# Patient Record
Sex: Male | Born: 1990 | Race: Black or African American | Hispanic: No | Marital: Single | Smoking: Current every day smoker
Health system: Southern US, Community
[De-identification: ages and names within clinical notes are randomized; demographics above are authoritative.]

## PROBLEM LIST (undated history)

## (undated) DIAGNOSIS — J45909 Unspecified asthma, uncomplicated: Secondary | ICD-10-CM

---

## 2015-05-11 ENCOUNTER — Emergency Department (HOSPITAL_BASED_OUTPATIENT_CLINIC_OR_DEPARTMENT_OTHER): Payer: No Typology Code available for payment source

## 2015-05-11 ENCOUNTER — Encounter (HOSPITAL_BASED_OUTPATIENT_CLINIC_OR_DEPARTMENT_OTHER): Payer: Self-pay | Admitting: *Deleted

## 2015-05-11 ENCOUNTER — Emergency Department (HOSPITAL_BASED_OUTPATIENT_CLINIC_OR_DEPARTMENT_OTHER)
Admission: EM | Admit: 2015-05-11 | Discharge: 2015-05-11 | Disposition: A | Discharge status: Home / Self Care | Payer: No Typology Code available for payment source | Attending: Emergency Medicine | Admitting: Emergency Medicine

## 2015-05-11 DIAGNOSIS — Y92410 Unspecified street and highway as the place of occurrence of the external cause: Secondary | ICD-10-CM | POA: Insufficient documentation

## 2015-05-11 DIAGNOSIS — Z880 Allergy status to penicillin: Secondary | ICD-10-CM | POA: Diagnosis not present

## 2015-05-11 DIAGNOSIS — Z720 Tobacco use: Secondary | ICD-10-CM | POA: Diagnosis not present

## 2015-05-11 DIAGNOSIS — S199XXA Unspecified injury of neck, initial encounter: Secondary | ICD-10-CM | POA: Diagnosis not present

## 2015-05-11 DIAGNOSIS — S0990XA Unspecified injury of head, initial encounter: Secondary | ICD-10-CM | POA: Diagnosis not present

## 2015-05-11 DIAGNOSIS — S22060A Wedge compression fracture of T7-T8 vertebra, initial encounter for closed fracture: Secondary | ICD-10-CM | POA: Insufficient documentation

## 2015-05-11 DIAGNOSIS — Y998 Other external cause status: Secondary | ICD-10-CM | POA: Insufficient documentation

## 2015-05-11 DIAGNOSIS — Y9389 Activity, other specified: Secondary | ICD-10-CM | POA: Diagnosis not present

## 2015-05-11 DIAGNOSIS — IMO0002 Reserved for concepts with insufficient information to code with codable children: Secondary | ICD-10-CM

## 2015-05-11 DIAGNOSIS — T1490XA Injury, unspecified, initial encounter: Secondary | ICD-10-CM

## 2015-05-11 DIAGNOSIS — S299XXA Unspecified injury of thorax, initial encounter: Secondary | ICD-10-CM | POA: Diagnosis present

## 2015-05-11 MED ORDER — IBUPROFEN 800 MG PO TABS
800.0000 mg | ORAL_TABLET | Freq: Once | ORAL | Status: AC
  Administered 2015-05-11: 800 mg via ORAL
  Filled 2015-05-11: qty 1

## 2015-05-11 MED ORDER — DIAZEPAM 5 MG PO TABS: 5.0000 mg | ORAL_TABLET | Freq: Four times a day (QID) | ORAL | Status: AC | PRN

## 2015-05-11 MED ORDER — HYDROCODONE-ACETAMINOPHEN 5-325 MG PO TABS: 1.0000 | ORAL_TABLET | Freq: Four times a day (QID) | ORAL | Status: DC | PRN
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
## 2015-05-25 ENCOUNTER — Emergency Department (HOSPITAL_BASED_OUTPATIENT_CLINIC_OR_DEPARTMENT_OTHER)
Admission: EM | Admit: 2015-05-25 | Discharge: 2015-05-25 | Discharge status: Against Medical Advice | Payer: No Typology Code available for payment source | Attending: Emergency Medicine | Admitting: Emergency Medicine

## 2015-05-25 ENCOUNTER — Encounter (HOSPITAL_BASED_OUTPATIENT_CLINIC_OR_DEPARTMENT_OTHER): Payer: Self-pay | Admitting: *Deleted

## 2015-05-25 DIAGNOSIS — Z720 Tobacco use: Secondary | ICD-10-CM | POA: Insufficient documentation

## 2015-05-25 DIAGNOSIS — M549 Dorsalgia, unspecified: Secondary | ICD-10-CM | POA: Diagnosis present
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
## 2015-05-26 ENCOUNTER — Emergency Department (HOSPITAL_BASED_OUTPATIENT_CLINIC_OR_DEPARTMENT_OTHER)
Admission: EM | Admit: 2015-05-26 | Discharge: 2015-05-26 | Disposition: A | Discharge status: Home / Self Care | Payer: No Typology Code available for payment source | Attending: Emergency Medicine | Admitting: Emergency Medicine

## 2015-05-26 ENCOUNTER — Encounter (HOSPITAL_BASED_OUTPATIENT_CLINIC_OR_DEPARTMENT_OTHER): Payer: Self-pay | Admitting: Emergency Medicine

## 2015-05-26 ENCOUNTER — Emergency Department (HOSPITAL_BASED_OUTPATIENT_CLINIC_OR_DEPARTMENT_OTHER): Payer: No Typology Code available for payment source

## 2015-05-26 DIAGNOSIS — Z87828 Personal history of other (healed) physical injury and trauma: Secondary | ICD-10-CM | POA: Insufficient documentation

## 2015-05-26 DIAGNOSIS — Z720 Tobacco use: Secondary | ICD-10-CM | POA: Insufficient documentation

## 2015-05-26 DIAGNOSIS — M545 Low back pain: Secondary | ICD-10-CM | POA: Diagnosis present

## 2015-05-26 DIAGNOSIS — I880 Nonspecific mesenteric lymphadenitis: Secondary | ICD-10-CM | POA: Diagnosis not present

## 2015-05-26 DIAGNOSIS — R1084 Generalized abdominal pain: Secondary | ICD-10-CM | POA: Insufficient documentation

## 2015-05-26 DIAGNOSIS — M542 Cervicalgia: Secondary | ICD-10-CM | POA: Insufficient documentation

## 2015-05-26 DIAGNOSIS — Z880 Allergy status to penicillin: Secondary | ICD-10-CM | POA: Diagnosis not present

## 2015-05-26 DIAGNOSIS — R1110 Vomiting, unspecified: Secondary | ICD-10-CM | POA: Diagnosis not present

## 2015-05-26 DIAGNOSIS — G8911 Acute pain due to trauma: Secondary | ICD-10-CM | POA: Insufficient documentation

## 2015-05-26 DIAGNOSIS — M546 Pain in thoracic spine: Secondary | ICD-10-CM | POA: Diagnosis not present

## 2015-05-26 LAB — BASIC METABOLIC PANEL
Anion gap: 6 (ref 5–15)
BUN: 8 mg/dL (ref 6–20)
CHLORIDE: 105 mmol/L (ref 101–111)
CO2: 31 mmol/L (ref 22–32)
Calcium: 8.5 mg/dL — ABNORMAL LOW (ref 8.9–10.3)
Creatinine, Ser: 1.07 mg/dL (ref 0.61–1.24)
GFR calc Af Amer: >60 mL/min (ref >60)
GFR calc non Af Amer: >60 mL/min (ref >60)
GLUCOSE: 106 mg/dL — AB (ref 65–99)
POTASSIUM: 4.2 mmol/L (ref 3.5–5.1)
Sodium: 142 mmol/L (ref 135–145)

## 2015-05-26 MED ORDER — HYDROCODONE-ACETAMINOPHEN 5-325 MG PO TABS: 1.0000 | ORAL_TABLET | Freq: Four times a day (QID) | ORAL | Status: DC | PRN

## 2015-05-26 MED ORDER — IBUPROFEN 800 MG PO TABS: 800.0000 mg | ORAL_TABLET | Freq: Three times a day (TID) | ORAL | Status: DC

## 2015-05-26 MED ORDER — CYCLOBENZAPRINE HCL 10 MG PO TABS
10.0000 mg | ORAL_TABLET | Freq: Once | ORAL | Status: AC
  Administered 2015-05-26: 10 mg via ORAL
  Filled 2015-05-26: qty 1

## 2015-05-26 MED ORDER — ONDANSETRON HCL 4 MG/2ML IJ SOLN
4.0000 mg | Freq: Once | INTRAMUSCULAR | Status: AC
  Administered 2015-05-26: 4 mg via INTRAVENOUS
  Filled 2015-05-26: qty 2

## 2015-05-26 MED ORDER — IBUPROFEN 800 MG PO TABS
800.0000 mg | ORAL_TABLET | Freq: Once | ORAL | Status: AC
  Administered 2015-05-26: 800 mg via ORAL
  Filled 2015-05-26: qty 1

## 2015-05-26 MED ORDER — HYDROCODONE-ACETAMINOPHEN 5-325 MG PO TABS
2.0000 | ORAL_TABLET | Freq: Once | ORAL | Status: AC
  Administered 2015-05-26: 2 via ORAL
  Filled 2015-05-26: qty 2

## 2015-05-26 MED ORDER — IOHEXOL 300 MG/ML  SOLN
100.0000 mL | Freq: Once | INTRAMUSCULAR | Status: AC | PRN
  Administered 2015-05-26: 100 mL via INTRAVENOUS

## 2015-05-26 MED ORDER — SODIUM CHLORIDE 0.9 % IV BOLUS (SEPSIS)
1000.0000 mL | Freq: Once | INTRAVENOUS | Status: AC
  Administered 2015-05-26: 1000 mL via INTRAVENOUS

## 2015-05-26 NOTE — ED Notes (Signed)
Pt states that he was unable to afford his vicodin or valium prescribed at previous visit
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
## 2015-05-26 NOTE — Discharge Instructions (Signed)
Lymphadenopathy Lymphadenopathy refers to swollen or enlarged lymph glands, also called lymph nodes. Lymph glands are part of your body's defense (immune) system, which protects the body from infections, germs, and diseases. Lymph glands are found in many locations in your body, including the neck, underarm, and groin.  Many things can cause lymph glands to become enlarged. When your immune system responds to germs, such as viruses or bacteria, infection-fighting cells and fluid build up. This causes the glands to grow in size. Usually, this is not something to worry about. The swelling and any soreness often go away without treatment. However, swollen lymph glands can also be caused by a number of diseases. Your health care provider may do various tests to help determine the cause. If the cause of your swollen lymph glands cannot be found, it is important to monitor your condition to make sure the swelling goes away. HOME CARE INSTRUCTIONS Watch your condition for any changes. The following actions may help to lessen any discomfort you are feeling:  Get plenty of rest.  Take medicines only as directed by your health care provider. Your health care provider may recommend over-the-counter medicines for pain.  Apply moist heat compresses to the site of swollen lymph nodes as directed by your health care provider. This can help reduce any pain.  Check your lymph nodes daily for any changes.  Keep all follow-up visits as directed by your health care provider. This is important. SEEK MEDICAL CARE IF:  Your lymph nodes are still swollen after 2 weeks.  Your swelling increases or spreads to other areas.  Your lymph nodes are hard, seem fixed to the skin, or are growing rapidly.  Your skin over the lymph nodes is red and inflamed.  You have a fever.  You have chills.  You have fatigue.  You develop a sore throat.  You have abdominal pain.  You have weight loss.  You have night  sweats. SEEK IMMEDIATE MEDICAL CARE IF:  You notice fluid leaking from the area of the enlarged lymph node.  You have severe pain in any area of your body.  You have chest pain.  You have shortness of breath.   This information is not intended to replace advice given to you by your health care provider. Make sure you discuss any questions you have with your health care provider.   Document Released: 04/26/2008 Document Revised: 08/08/2014 Document Reviewed: 02/20/2014 Elsevier Interactive Patient Education 2016 [employer].   Emergency Department Resource Guide 1) Find a Doctor and Pay Out of Pocket Although you won't have to find out who is covered by your insurance plan, it is a good idea to ask around and get recommendations. You will then need to call the office and see if the doctor you have chosen will accept you as a new patient and what types of options they offer for patients who are self-pay. Some doctors offer discounts or will set up payment plans for their patients who do not have insurance, but you will need to ask so you aren't surprised when you get to your appointment.  2) Contact Your Local Health Department Not all health departments have doctors that can see patients for sick visits, but many do, so it is worth a call to see if yours does. If you don't know where your local health department is, you can check in your phone book. The CDC also has a tool to help you locate your state's health department, and many state websites  also have listings of all of their local health departments.  3) Find a Walk-in Clinic If your illness is not likely to be very severe or complicated, you may want to try a walk in clinic. These are popping up all over the country in pharmacies, drugstores, and shopping centers. They're usually staffed by nurse practitioners or physician assistants that have been trained to treat common illnesses and complaints. They're usually fairly quick and  inexpensive. However, if you have serious medical issues or chronic medical problems, these are probably not your best option.  No Primary Care Doctor: - Call Health Connect at  [phone] - they can help you locate a primary care doctor that  accepts your insurance, provides certain services, etc. - Physician Referral Service- [phone]  Chronic Pain Problems: Organization         Address  Phone   Notes  [facility]  [phone] Patients need to be referred by their primary care doctor.   Medication Assistance: Organization         Address  Phone   Notes  [facility] [address] [place], [place] [address] [phone] --Must be a resident of [facility] [place] -- Must have NO insurance coverage whatsoever (no Medicaid/ Medicare, etc.) -- The pt. MUST have a primary care doctor that directs their care regularly and follows them in the community   [facility]  [phone]   [employer]  [phone]    Agencies that provide inexpensive medical care: Organization         Address  Phone   Notes  [facility]  [phone]   [facility]    [phone]   [facility] [address] [place], [place] [address] [phone]   [facility] [address], [place] [phone]   [facility]    [phone]   [facility]    [phone]   [facility]  [address], [place] Phone:  [phone], Fax:  [phone] Hours of Operation:  9 am - 6 pm, M-F.  Also accepts Medicaid/Medicare and self-pay.  [facility]  [address], [place] Phone: [phone], Fax: [phone]. Hours of Operation:  8:30 am - 5:30 pm, M-F.  Also accepts Medicaid and self-pay.  [facility] [place] [address], [place] Phone: [phone](336) [phone]   [facility] [address], [place], [place] [phone], Ext. [phone] Mondays & Thursdays: 7-9 AM.  First 15 patients are seen on a first come, first serve basis.    Medicaid-accepting [facility] Providers:  Organization         Address  Phone   Notes  [facility] [address], [place] [phone] Also accepts self-pay patients.  [facility] [address], [place]  [phone]) [phone]   [facility] [address], [place] [phone]   [facility] [address] [place] [address], [place] [phone]   Renaye RakersVeita Bland [address], [place]   [phone] Only accepts [place] patients after they have their name applied to their card.   Self-Pay (no insurance) in [facility]:  [employer]         Address  Phone  Notes  °Sickle Cell Patients, [facility] [address], [place] [phone]   °[facility] [address], [place] ([phone]) [phone]   °[facility] ° [address], [place] [phone]) [phone]   °[facility]/Dr. Osei-Bonsu ° [address] [place] [address], [place] or [address], [place] [phone] Phone number for both [place] and [place] locations is the same.  °[facility] [address], [place] [phone]   °[facility] [address] [place] [address], [place] or [address] ([phone]) [phone] °(336) [phone]   °[facility] [address], [place] ([phone]) [phone], phone; [phone], fax Sees patients 1st and 3rd Saturday of every month.  Must not qualify for public or private insurance (i.e. Medicaid, Medicare, [place] Health Choice, Veterans' Benefits) • Household income should be no more than 200% of the poverty level •The clinic cannot treat you if you are pregnant or  think you are pregnant • Sexually transmitted diseases are not treated at the clinic.  ° ° °Dental Care: °Organization         Address  Phone  Notes  °[facility] [address], [place] [phone]) [phone] Accepts children up to age 21 who are enrolled in Medicaid or [place] Health Choice; pregnant women with a Medicaid card; and children who have applied for Medicaid or [place] Health Choice, but were declined, whose parents can pay a reduced fee at time of service.  °[facility] [place]  [address], [place] [phone]) [phone] Accepts children up to age 21 who are enrolled in Medicaid or [place] Health Choice; pregnant women with a Medicaid card; and children who have applied for Medicaid or [place] Health Choice, but were declined, whose parents can pay a reduced fee at time of service.  °[facility] ° [address], [place] [phone]) [phone] Patients are seen by appointment only. Walk-ins are not accepted. [facility] will see patients 18 years of age and older. °Monday - Tuesday (8am-5pm) °Most Wednesdays (8:30-5pm) °$30 per visit, cash only  °[facility] ° [address], [place] [phone]) [phone] Patients are seen by appointment only. Walk-ins are not accepted. [facility] will see patients 18 years of age and older. °One Wednesday Evening (Monthly: Volunteer Based).  $30 per visit, cash only  °[facility]  [phone] for adults; Children under age 4, call Graduate Pediatric Dentistry at [phone]. Children aged 4-14, please call [phone] to request a pediatric application. ° Dental services are provided in all areas of dental care including fillings, crowns and bridges, complete and partial dentures, implants, gum treatment, root canals, and extractions. Preventive care is also provided. Treatment is provided to both adults  and children. °Patients are selected via a lottery and there is often a waiting list. °  °[facility] [address], °[place] ° (336) [phone] [email or web address] °  °[facility] [address], [place], [place] [phone], Ext. [phone] Second and Fourth Thursday of each month, opens at 6:30 AM; Clinic ends at 9 AM.  Patients are seen on a first-come first-served basis, and a limited number are seen during each clinic.  ° °[facility] ° [address], [place], [place] [phone]) [phone]     Eligibility Requirements °You must have lived in [place], [place], or [place] counties for at least the last three months. °  You cannot be eligible for state or federal sponsored healthcare insurance, including Veterans Administration, Medicaid, or Medicare. °  You generally cannot be eligible for healthcare insurance through your employer.  °  How to apply: °Eligibility screenings are held every Tuesday and Wednesday afternoon from 1:00 pm until 4:00 pm. You do not need an appointment for the interview!  °[facility] [address], [place], [place] [phone]   °[facility]  336-342-8273   °[facility]  336-703-3100   °[facility]  336-570-6415   ° °Behavioral Health Resources in the Community: °Intensive Outpatient Programs °Organization         Address  Phone  Notes  °[facility] [address], [place], [place] [phone]   °[facility] [address], [place], [place] 336-832-9800   °[facility] [address], [place], [place] ° 336-882-2125   °[facility] [address],  °[place], [place] 1-800-853-5163 or 336-641-4981   °Substance Abuse Resources °Organization         Address  Phone  Notes  °[facility]  336-882-2125   °[facility]  336-784-9470   °[facility]  336-285-9073     °[facility]  336-845-3988   °Residential & Outpatient Substance Abuse Program  1-800-659-3381   °Psychological Services °Organization         Address  Phone  Notes  °[facility]  336- 832-9600   °[facility]  336- 378-7881   °[facility] [address], [place] 1-800-853-5163 or 336-641-4981   ° °Mobile Crisis Teams °Organization         Address  Phone  Notes  °[facility], Mobile Crisis Care Unit  1-877-626-1772   °Assertive °[facility] ° [address] [place], [place] 336-834-9664   °Sharon DeEsch [address] °[place] 336-554-5454   ° °Self-Help/Support Groups °Organization         Address  Phone             Notes  °[facility] [place] - variety of support groups  336- 373-1402 Call for more information  °Narcotics Anonymous (NA), [facility] [address], °[place]  2 meetings at this location  ° °Residential Treatment Programs °Organization         Address  Phone  Notes  °[facility] [address],    °[place]  1-866-801-8205   °[facility] ° [address], [place], [place] 704-293-8524   °[facility] [address], [place] 336-845-3988 Admissions: 8am-3pm M-F  °[facility] [address]    °[place], [place] 336-841-1104   °[facility] [address], [place], [place] 336-379-7146   °[facility] [address]  °[place], [place] 336-285-9073   °[facility] - Intensive Outpatient [address], [place], [place] 336-852-3033   °[facility] [address]  °[place], [place] 1-877-615-2722 or 336-784-9470   °[facility] ([facility]) [address] [place], [place] 336-227-7417 Accepts Medicaid  °[facility] [address]  ° [place] 1-800-659-3381 Substance Abuse/Addiction Treatment  ° °[place]  Behavioral Health Resources °Organization           Address  Phone  Notes  °[facility]  (888) 581-9988   °Julie Brannon, PhD [address] [place], [place]   (336) 349-5553 or (336) 951-0000   °[facility]   [address] °[place], [place] (336) 349-4454   °[facility] [address], [place], [place] (336) 342-8316 Insurance/Medicaid/sponsorship through [facility]  °[facility] [address]                                    [place], [place] (336) 342-8316 Therapy/tele-psych/case  °[facility] [address]  ° [place], [place] (336) 349-2233    °Dr. Arfeen  (336) 349-4544   °[facility] [place]  [employer] [place] [facility] 1) [address], [place] °2) [address], [place] °3)  [address], [place] (336) 349-3220 °(336) 342-7768 ° °(336) 342-8140   °[place] Child Abuse Hotline (336) 342-1394 or (336) 342-3537 (After Hours)    ° ° ° °

## 2015-05-26 NOTE — ED Notes (Signed)
Patient transported to CT 

## 2015-11-24 ENCOUNTER — Emergency Department (HOSPITAL_BASED_OUTPATIENT_CLINIC_OR_DEPARTMENT_OTHER)
Admission: EM | Admit: 2015-11-24 | Discharge: 2015-11-24 | Disposition: A | Discharge status: Home / Self Care | Payer: No Typology Code available for payment source | Source: Home / Self Care

## 2015-11-24 ENCOUNTER — Encounter (HOSPITAL_BASED_OUTPATIENT_CLINIC_OR_DEPARTMENT_OTHER): Payer: Self-pay | Admitting: *Deleted

## 2015-11-24 ENCOUNTER — Emergency Department (HOSPITAL_BASED_OUTPATIENT_CLINIC_OR_DEPARTMENT_OTHER)
Admission: EM | Admit: 2015-11-24 | Discharge: 2015-11-24 | Disposition: A | Discharge status: Home / Self Care | Payer: No Typology Code available for payment source | Attending: Emergency Medicine | Admitting: Emergency Medicine

## 2015-11-24 ENCOUNTER — Encounter (HOSPITAL_BASED_OUTPATIENT_CLINIC_OR_DEPARTMENT_OTHER): Payer: Self-pay | Admitting: Emergency Medicine

## 2015-11-24 DIAGNOSIS — Y939 Activity, unspecified: Secondary | ICD-10-CM | POA: Insufficient documentation

## 2015-11-24 DIAGNOSIS — Y999 Unspecified external cause status: Secondary | ICD-10-CM | POA: Insufficient documentation

## 2015-11-24 DIAGNOSIS — F17210 Nicotine dependence, cigarettes, uncomplicated: Secondary | ICD-10-CM | POA: Insufficient documentation

## 2015-11-24 DIAGNOSIS — M549 Dorsalgia, unspecified: Secondary | ICD-10-CM | POA: Insufficient documentation

## 2015-11-24 DIAGNOSIS — X58XXXA Exposure to other specified factors, initial encounter: Secondary | ICD-10-CM | POA: Insufficient documentation

## 2015-11-24 DIAGNOSIS — M62830 Muscle spasm of back: Secondary | ICD-10-CM | POA: Insufficient documentation

## 2015-11-24 DIAGNOSIS — Y929 Unspecified place or not applicable: Secondary | ICD-10-CM | POA: Diagnosis not present

## 2015-11-24 DIAGNOSIS — Z5321 Procedure and treatment not carried out due to patient leaving prior to being seen by health care provider: Secondary | ICD-10-CM | POA: Insufficient documentation

## 2015-11-24 DIAGNOSIS — S39012A Strain of muscle, fascia and tendon of lower back, initial encounter: Secondary | ICD-10-CM | POA: Diagnosis not present

## 2015-11-24 DIAGNOSIS — M545 Low back pain: Secondary | ICD-10-CM | POA: Diagnosis present

## 2015-11-24 MED ORDER — METHOCARBAMOL 500 MG PO TABS: 500.0000 mg | ORAL_TABLET | Freq: Two times a day (BID) | ORAL | Status: AC

## 2015-11-24 MED ORDER — IBUPROFEN 800 MG PO TABS: 800.0000 mg | ORAL_TABLET | Freq: Three times a day (TID) | ORAL | Status: DC

## 2015-11-24 MED ORDER — IBUPROFEN 800 MG PO TABS
800.0000 mg | ORAL_TABLET | Freq: Once | ORAL | Status: AC
  Administered 2015-11-24: 800 mg via ORAL
  Filled 2015-11-24: qty 1
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
## 2015-11-24 NOTE — ED Notes (Signed)
Pt states he had to take his children home.

## 2015-11-24 NOTE — ED Notes (Signed)
No difficulty walking and no trouble with urination or defication.  Pt. Is in no distress with no Resp. Complaints.

## 2015-11-24 NOTE — ED Notes (Signed)
Pt having back pain x1 month, checked in earlier but had to leave not seen
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
## 2015-11-24 NOTE — Discharge Instructions (Signed)
1. Medications: robaxin, ibuprofen, usual home medications °2. Treatment: rest, drink plenty of fluids, gentle stretching as discussed, alternate ice and heat °3. Follow Up: Please followup with your primary doctor in 3 days for discussion of your diagnoses and further evaluation after today's visit; if you do not have a primary care doctor use the resource guide provided to find one;  Return to the ER for worsening back pain, difficulty walking, loss of bowel or bladder control or other concerning symptoms ° ° ° °Back Exercises °The following exercises strengthen the muscles that help to support the back. They also help to keep the lower back flexible. Doing these exercises can help to prevent back pain or lessen existing pain. °If you have back pain or discomfort, try doing these exercises 2-3 times each day or as told by your health care provider. When the pain goes away, do them once each day, but increase the number of times that you repeat the steps for each exercise (do more repetitions). If you do not have back pain or discomfort, do these exercises once each day or as told by your health care provider. °EXERCISES °Single Knee to Chest °Repeat these steps 3-5 times for each leg: °1. Lie on your back on a firm bed or the floor with your legs extended. °2. Bring one knee to your chest. Your other leg should stay extended and in contact with the floor. °3. Hold your knee in place by grabbing your knee or thigh. °4. Pull on your knee until you feel a gentle stretch in your lower back. °5. Hold the stretch for 10-30 seconds. °6. Slowly release and straighten your leg. °Pelvic Tilt °Repeat these steps 5-10 times: °1. Lie on your back on a firm bed or the floor with your legs extended. °2. Bend your knees so they are pointing toward the ceiling and your feet are flat on the floor. °3. Tighten your lower abdominal muscles to press your lower back against the floor. This motion will tilt your pelvis so your tailbone  points up toward the ceiling instead of pointing to your feet or the floor. °4. With gentle tension and even breathing, hold this position for 5-10 seconds. °Cat-Cow °Repeat these steps until your lower back becomes more flexible: °1. Get into a hands-and-knees position on a firm surface. Keep your hands under your shoulders, and keep your knees under your hips. You may place padding under your knees for comfort. °2. Let your head hang down, and point your tailbone toward the floor so your lower back becomes rounded like the back of a cat. °3. Hold this position for 5 seconds. °4. Slowly lift your head and point your tailbone up toward the ceiling so your back forms a sagging arch like the back of a cow. °5. Hold this position for 5 seconds. °Press-Ups °Repeat these steps 5-10 times: °1. Lie on your abdomen (face-down) on the floor. °2. Place your palms near your head, about shoulder-width apart. °3. While you keep your back as relaxed as possible and keep your hips on the floor, slowly straighten your arms to raise the top half of your body and lift your shoulders. Do not use your back muscles to raise your upper torso. You may adjust the placement of your hands to make yourself more comfortable. °4. Hold this position for 5 seconds while you keep your back relaxed. °5. Slowly return to lying flat on the floor. °Bridges °Repeat these steps 10 times: °1. Lie on your back   on a firm surface. °2. Bend your knees so they are pointing toward the ceiling and your feet are flat on the floor. °3. Tighten your buttocks muscles and lift your buttocks off of the floor until your waist is at almost the same height as your knees. You should feel the muscles working in your buttocks and the back of your thighs. If you do not feel these muscles, slide your feet 1-2 inches farther away from your buttocks. °4. Hold this position for 3-5 seconds. °5. Slowly lower your hips to the starting position, and allow your buttocks muscles to  relax completely. °If this exercise is too easy, try doing it with your arms crossed over your chest. °Abdominal Crunches °Repeat these steps 5-10 times: °1. Lie on your back on a firm bed or the floor with your legs extended. °2. Bend your knees so they are pointing toward the ceiling and your feet are flat on the floor. °3. Cross your arms over your chest. °4. Tip your chin slightly toward your chest without bending your neck. °5. Tighten your abdominal muscles and slowly raise your trunk (torso) high enough to lift your shoulder blades a tiny bit off of the floor. Avoid raising your torso higher than that, because it can put too much stress on your low back and it does not help to strengthen your abdominal muscles. °6. Slowly return to your starting position. °Back Lifts °Repeat these steps 5-10 times: °1. Lie on your abdomen (face-down) with your arms at your sides, and rest your forehead on the floor. °2. Tighten the muscles in your legs and your buttocks. °3. Slowly lift your chest off of the floor while you keep your hips pressed to the floor. Keep the back of your head in line with the curve in your back. Your eyes should be looking at the floor. °4. Hold this position for 3-5 seconds. °5. Slowly return to your starting position. °SEEK MEDICAL CARE IF: °· Your back pain or discomfort gets much worse when you do an exercise. °· Your back pain or discomfort does not lessen within 2 hours after you exercise. °If you have any of these problems, stop doing these exercises right away. Do not do them again unless your health care provider says that you can. °SEEK IMMEDIATE MEDICAL CARE IF: °· You develop sudden, severe back pain. If this happens, stop doing the exercises right away. Do not do them again unless your health care provider says that you can. °  °This information is not intended to replace advice given to you by your health care provider. Make sure you discuss any questions you have with your health  care provider. °  °Document Released: 08/25/2004 Document Revised: 04/08/2015 Document Reviewed: 09/11/2014 °Elsevier Interactive Patient Education ©2016 [employer] ° °

## 2015-12-07 ENCOUNTER — Emergency Department (HOSPITAL_BASED_OUTPATIENT_CLINIC_OR_DEPARTMENT_OTHER)
Admission: EM | Admit: 2015-12-07 | Discharge: 2015-12-07 | Disposition: A | Discharge status: Home / Self Care | Payer: No Typology Code available for payment source | Attending: Emergency Medicine | Admitting: Emergency Medicine

## 2015-12-07 ENCOUNTER — Encounter (HOSPITAL_BASED_OUTPATIENT_CLINIC_OR_DEPARTMENT_OTHER): Payer: Self-pay | Admitting: *Deleted

## 2015-12-07 DIAGNOSIS — J069 Acute upper respiratory infection, unspecified: Secondary | ICD-10-CM | POA: Insufficient documentation

## 2015-12-07 DIAGNOSIS — F17210 Nicotine dependence, cigarettes, uncomplicated: Secondary | ICD-10-CM | POA: Insufficient documentation

## 2015-12-07 HISTORY — DX: Unspecified asthma, uncomplicated: J45.909

## 2015-12-07 MED ORDER — FLUTICASONE PROPIONATE 50 MCG/ACT NA SUSP: 1.0000 | Freq: Every day | NASAL | Status: AC

## 2015-12-07 MED ORDER — BENZONATATE 100 MG PO CAPS: 100.0000 mg | ORAL_CAPSULE | Freq: Three times a day (TID) | ORAL | Status: DC

## 2015-12-07 NOTE — ED Notes (Signed)
Sinus congestion x 2 days. 

## 2015-12-07 NOTE — ED Provider Notes (Signed)
CSN: [id number]     Arrival date & time 12/07/15  1308 History   First MD Initiated Contact with Patient 12/07/15 [phone]     No chief complaint on file.    (Consider location/radiation/quality/duration/timing/severity/associated sxs/prior Treatment) HPI Comments: The patient is a 25 year old male, he has a history of approximately 3 days of runny nose, coughing, nothing seems to make this better or worse, associated subjective fevers, no nausea vomiting or other symptoms. Symptoms are persistent, mild, nothing makes better or worse  The history is provided by the patient.    No past medical history on file. No past surgical history on file. No family history on file. Social History  Substance Use Topics  . Smoking status: Current Every Day Smoker -- 0.50 packs/day    Types: Cigarettes  . Smokeless tobacco: Not on file  . Alcohol Use: No    Review of Systems  Constitutional: Positive for fever.  HENT: Positive for rhinorrhea.   Respiratory: Positive for cough.       Allergies  Penicillins  Home Medications   Prior to Admission medications   Medication Sig Start Date End Date Taking? Authorizing Provider  benzonatate (TESSALON) 100 MG capsule Take 1 capsule (100 mg total) by mouth every 8 (eight) hours. 12/07/15   Eber Hong, MD  diazepam (VALIUM) 5 MG tablet Take 1 tablet (5 mg total) by mouth every 6 (six) hours as needed for muscle spasms. 05/11/15   Leta Baptist, MD  fluticasone (FLONASE) 50 MCG/ACT nasal spray Place 1 spray into both nostrils daily. 12/07/15   Eber Hong, MD  HYDROcodone-acetaminophen (NORCO) 5-325 MG tablet Take 1 tablet by mouth every 6 (six) hours as needed for moderate pain. 05/11/15   Leta Baptist, MD  HYDROcodone-acetaminophen (NORCO/VICODIN) 5-325 MG tablet Take 1 tablet by mouth every 6 (six) hours as needed for moderate pain. 05/26/15   Elwin Mocha, MD  ibuprofen (ADVIL,MOTRIN) 800 MG tablet Take 1 tablet (800 mg total) by mouth 3 (three)  times daily. 11/24/15   Hannah Muthersbaugh, PA-C  methocarbamol (ROBAXIN) 500 MG tablet Take 1 tablet (500 mg total) by mouth 2 (two) times daily. 11/24/15   Hannah Muthersbaugh, PA-C   BP 118/86 mmHg  Pulse 84  Temp(Src) 97.9 F (36.6 C) (Oral)  Resp 16  Ht  (1.702 m)  Wt 185 lb (83.915 kg)  BMI 28.97 kg/m2  SpO2 98% Physical Exam  Constitutional: He appears well-developed and well-nourished.  HENT:  Head: Normocephalic and atraumatic.  Clear rhinorrhea present, oropharynx clear and moist, no exudate asymmetry hypertrophy or erythema, normal phonation, moist mucous membranes, tympanic membranes normal  Eyes: Conjunctivae are normal. Right eye exhibits no discharge. Left eye exhibits no discharge.  Pulmonary/Chest: Effort normal. No respiratory distress.  Lungs are totally clear to auscultation, speaks in full sentences without distress or increased work of breathing  Neurological: He is alert. Coordination normal.  Skin: Skin is warm and dry. No rash noted. He is not diaphoretic. No erythema.  Psychiatric: He has a normal mood and affect.  Nursing note and vitals reviewed.   ED Course  Procedures (including critical care time) Labs Review Labs Reviewed - No data to display  Imaging Review No results found. I have personally reviewed and evaluated these images and lab results as part of my medical decision-making.    MDM   Final diagnoses:  URI (upper respiratory infection)    Well-appearing, vitals normal, no hypoxia tachycardia hypotension or other concerning signs. Lungs are totally  clear, runny nose, likely has viral upper respiratory infection, medications to be given as below, the patient is in agreement with the plan. Antibiotics and imaging not indicated  Meds given in ED:  Medications - No data to display  New Prescriptions   BENZONATATE (TESSALON) 100 MG CAPSULE    Take 1 capsule (100 mg total) by mouth every 8 (eight) hours.   FLUTICASONE (FLONASE) 50  MCG/ACT NASAL SPRAY    Place 1 spray into both nostrils daily.        Eber HongBrian Jericka Kadar, MD 12/07/15 [phone]

## 2015-12-07 NOTE — Discharge Instructions (Signed)
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
## 2023-07-13 ENCOUNTER — Other Ambulatory Visit: Payer: Self-pay

## 2023-07-13 ENCOUNTER — Emergency Department (HOSPITAL_BASED_OUTPATIENT_CLINIC_OR_DEPARTMENT_OTHER): Payer: Self-pay

## 2023-07-13 ENCOUNTER — Emergency Department (HOSPITAL_BASED_OUTPATIENT_CLINIC_OR_DEPARTMENT_OTHER)
Admission: EM | Admit: 2023-07-13 | Discharge: 2023-07-13 | Disposition: A | Discharge status: Home / Self Care | Payer: Self-pay | Attending: Emergency Medicine | Admitting: Emergency Medicine

## 2023-07-13 ENCOUNTER — Encounter (HOSPITAL_BASED_OUTPATIENT_CLINIC_OR_DEPARTMENT_OTHER): Payer: Self-pay | Admitting: Urology

## 2023-07-13 DIAGNOSIS — J45909 Unspecified asthma, uncomplicated: Secondary | ICD-10-CM | POA: Insufficient documentation

## 2023-07-13 DIAGNOSIS — R059 Cough, unspecified: Secondary | ICD-10-CM | POA: Insufficient documentation

## 2023-07-13 DIAGNOSIS — K0889 Other specified disorders of teeth and supporting structures: Secondary | ICD-10-CM | POA: Insufficient documentation

## 2023-07-13 DIAGNOSIS — Z720 Tobacco use: Secondary | ICD-10-CM | POA: Insufficient documentation

## 2023-07-13 DIAGNOSIS — Z1152 Encounter for screening for COVID-19: Secondary | ICD-10-CM | POA: Insufficient documentation

## 2023-07-13 DIAGNOSIS — Z7951 Long term (current) use of inhaled steroids: Secondary | ICD-10-CM | POA: Insufficient documentation

## 2023-07-13 LAB — RESP PANEL BY RT-PCR (RSV, FLU A&B, COVID)  RVPGX2
Influenza A by PCR: NEGATIVE
Influenza B by PCR: NEGATIVE
Resp Syncytial Virus by PCR: NEGATIVE
SARS Coronavirus 2 by RT PCR: NEGATIVE

## 2023-07-13 MED ORDER — TRIAMCINOLONE ACETONIDE 55 MCG/ACT NA AERO: 2.0000 | INHALATION_SPRAY | Freq: Every day | NASAL | 12 refills | Status: AC

## 2023-07-13 MED ORDER — BENZONATATE 100 MG PO CAPS: 100.0000 mg | ORAL_CAPSULE | Freq: Three times a day (TID) | ORAL | 0 refills | Status: AC | PRN

## 2023-07-13 MED ORDER — CETIRIZINE-PSEUDOEPHEDRINE ER 5-120 MG PO TB12: 1.0000 | ORAL_TABLET | Freq: Every day | ORAL | 0 refills | Status: DC | PRN

## 2023-07-13 MED ORDER — KETOROLAC TROMETHAMINE 30 MG/ML IJ SOLN
30.0000 mg | Freq: Once | INTRAMUSCULAR | Status: AC
  Administered 2023-07-13: 30 mg via INTRAMUSCULAR
  Filled 2023-07-13: qty 1

## 2023-07-13 MED ORDER — CLINDAMYCIN HCL 300 MG PO CAPS: 300.0000 mg | ORAL_CAPSULE | Freq: Three times a day (TID) | ORAL | 0 refills | Status: AC

## 2023-07-13 MED ORDER — IBUPROFEN 600 MG PO TABS: 600.0000 mg | ORAL_TABLET | Freq: Four times a day (QID) | ORAL | 0 refills | Status: DC | PRN

## 2023-07-13 NOTE — ED Provider Notes (Signed)
[place] [facility] Provider Note   CSN: [id number] Arrival date & time: 07/13/23  1244     History  Chief Complaint  Patient presents with   Flu like symptoms    Dental Pain    Raymond Montoya is a 32 y.o. male.   Dental Pain   32 year old male presents emergency department with complaints of cough, congestion, right-sided dental pain.  Regarding right dental pain, states that he had a for several months with acute worsening over the past few days.  Denies any reported breathing/swallowing.  States he knows he has cavities on affected side and is trying to get care established with a dentist.  Has been taking Tylenol at home without significant improvement.  Patient also states for the past 2 weeks, has had cough, congestion.  Has tried no medications for the symptoms.  Denies any chest pain, shortness of breath, abdominal pain, nausea, vomiting.  Past medical history significant for asthma  Home Medications Prior to Admission medications   Medication Sig Start Date End Date Taking? Authorizing Provider  benzonatate (TESSALON) 100 MG capsule Take 1 capsule (100 mg total) by mouth 3 (three) times daily as needed. 07/13/23  Yes Sherian Maroon A, PA  cetirizine-pseudoephedrine (ZYRTEC-D) 5-120 MG tablet Take 1 tablet by mouth daily as needed for allergies. 07/13/23  Yes Sherian Maroon A, PA  clindamycin (CLEOCIN) 300 MG capsule Take 1 capsule (300 mg total) by mouth 3 (three) times daily for 10 days. 07/13/23 07/23/23 Yes Sherian Maroon A, PA  ibuprofen (ADVIL) 600 MG tablet Take 1 tablet (600 mg total) by mouth every 6 (six) hours as needed. 07/13/23  Yes Sherian Maroon A, PA  triamcinolone (NASACORT) 55 MCG/ACT AERO nasal inhaler Place 2 sprays into the nose daily. 07/13/23  Yes Sherian Maroon A, PA  diazepam (VALIUM) 5 MG tablet Take 1 tablet (5 mg total) by mouth every 6 (six) hours as needed for muscle spasms. 05/11/15   Leta Baptist,  MD  fluticasone [facility]) 50 MCG/ACT nasal spray Place 1 spray into both nostrils daily. 12/07/15   Eber Hong, MD  HYDROcodone-acetaminophen (NORCO) 5-325 MG tablet Take 1 tablet by mouth every 6 (six) hours as needed for moderate pain. 05/11/15   Leta Baptist, MD  HYDROcodone-acetaminophen (NORCO/VICODIN) 5-325 MG tablet Take 1 tablet by mouth every 6 (six) hours as needed for moderate pain. 05/26/15   Elwin Mocha, MD  methocarbamol (ROBAXIN) 500 MG tablet Take 1 tablet (500 mg total) by mouth 2 (two) times daily. 11/24/15   Muthersbaugh, Dahlia Client, PA-C      Allergies    Penicillins    Review of Systems   Review of Systems  All other systems reviewed and are negative.   Physical Exam Updated Vital Signs BP (!) 134/100 (BP Location: Left Arm)   Pulse 84   Temp 98.6 F (37 C) (Oral)   Resp 16   Ht 5\' 7"  (1.702 m)   Wt 83.9 kg   SpO2 98%   BMI 28.97 kg/m  Physical Exam Vitals and nursing note reviewed.  Constitutional:      General: He is not in acute distress.    Appearance: He is well-developed.  HENT:     Head: Normocephalic and atraumatic.     Nose: Congestion and rhinorrhea present.     Mouth/Throat:      Comments: Patient with tenderness along gingiva in areas above.  Multiple cavities and teeth.  No obvious sublingual extremity but or  swelling.  Uvula midline right symmetric with phonation.  No trismus.  No changes in phonation per patient. Eyes:     Conjunctiva/sclera: Conjunctivae normal.  Cardiovascular:     Rate and Rhythm: Normal rate and regular rhythm.     Heart sounds: No murmur heard. Pulmonary:     Effort: Pulmonary effort is normal. No respiratory distress.     Breath sounds: Normal breath sounds. No wheezing, rhonchi or rales.  Abdominal:     Palpations: Abdomen is soft.     Tenderness: There is no abdominal tenderness.  Musculoskeletal:        General: No swelling.     Cervical back: Neck supple.  Skin:    General: Skin is warm and dry.      Capillary Refill: Capillary refill takes less than 2 seconds.  Neurological:     Mental Status: He is alert.  Psychiatric:        Mood and Affect: Mood normal.     ED Results / Procedures / Treatments   Labs (all labs ordered are listed, but only abnormal results are displayed) Labs Reviewed  RESP PANEL BY RT-PCR (RSV, FLU A&B, COVID)  [id number]    EKG None  Radiology DG Chest 2 View Result Date: 07/13/2023 CLINICAL DATA:  Cough for 2 weeks. EXAM: CHEST - 2 VIEW COMPARISON:  06/21/2016 FINDINGS: The heart size and mediastinal contours are within normal limits. Both lungs are clear. The visualized skeletal structures are unremarkable. IMPRESSION: Normal exam. Electronically Signed   By: Danae Orleans M.D.   On: 07/13/2023 14:58    Procedures Procedures    Medications Ordered in ED Medications  ketorolac (TORADOL) 30 MG/ML injection 30 mg (30 mg Intramuscular Given 07/13/23 1351)    ED Course/ Medical Decision Making/ A&P                                 Medical Decision Making Amount and/or Complexity of Data Reviewed Radiology: ordered.  Risk OTC drugs. Prescription drug management.   This patient presents to the ED for concern of dental pain, cough, this involves an extensive number of treatment options, and is a complaint that carries with it a high risk of complications and morbidity.  The differential diagnosis includes viral URI, COVID, flu, RSV PE, pneumonia cellulitis, necrotizing ulcerative gingivitis, Ludwig angina, PTA, periapical abscess   Co morbidities that complicate the patient evaluation  See HPI   Additional history obtained:  Additional history obtained from EMR External records from outside source obtained and reviewed including hospital records   Lab Tests:  I Ordered, and personally interpreted labs.  The pertinent results include: Respiratory panel negative   Imaging Studies ordered:  I ordered imaging studies including chest  x-ray I independently visualized and interpreted imaging which showed no acute cardiopulmonary abnormality I agree with the radiologist interpretation   Cardiac Monitoring: / EKG:  The patient was maintained on a cardiac monitor.  I personally viewed and interpreted the cardiac monitored which showed an underlying rhythm of: sinus rhythm   Consultations Obtained:  N/a   Problem List / ED Course / Critical interventions / Medication management  Dental pain, cough I ordered medication including Toradol  Reevaluation of the patient after these medicines showed that the patient improved I have reviewed the patients home medicines and have made adjustments as needed   Social Determinants of Health:  Tobacco use.  Denies illicit drug use.  Test / Admission - Considered:  Dental pain, cough Vitals signs within normal range and stable throughout visit. Laboratory/imaging studies significant for: See above 33 year old male presents emergency department with complaints of dental pain, nasal congestion, body aches.  On exam, patient without any clinical evidence of pneumonia but given duration of symptoms, chest x-ray was performed which was negative for any acute cardiopulmonary abnormalities.  Patient with negative viral testing.  Suspect that the patient symptoms likely secondary to viral URI.  Recommend symptomatic therapy as described in AVS.  Pharyngeal pain, no evidence of Ludwig angina or drainable abscess.  Will place patient on antibiotics and have him follow-up with dentistry in the outpatient setting to have affected teeth addressed.  Treatment plan discussed length with patient and he acknowledged understanding was agreeable to said plan.  Patient is a well-appearing, afebrile in no acute distress, tolerating p.o. without difficulty. Worrisome signs and symptoms were discussed with the patient, and the patient acknowledged understanding to return to the ED if noticed. Patient was  stable upon discharge.          Final Clinical Impression(s) / ED Diagnoses Final diagnoses:  Pain, dental  Cough, unspecified type    Rx / DC Orders ED Discharge Orders     None         Peter Garter, [place] 07/13/23 1648    Vanetta Mulders, MD 07/17/23 1112

## 2023-07-13 NOTE — ED Triage Notes (Signed)
Pt states dental pain on right side, headache x 2 weeks  States weakness, body aches, chills, congestion, cough   No dentist established

## 2023-07-13 NOTE — Discharge Instructions (Addendum)
As discussed, you tested negative for COVID, flu, RSV.  Chest x-ray without obvious pneumonia.  Will treat you with antibiotics and anti-inflammatories for treatment of your tooth pain.  Information attached to discharge papers regarding setting up an appointment with dentist in the outpatient setting.  Please do not hesitate to return if he worrisome signs and symptoms we discussed become apparent.

## 2023-09-05 ENCOUNTER — Encounter (HOSPITAL_BASED_OUTPATIENT_CLINIC_OR_DEPARTMENT_OTHER): Payer: Self-pay

## 2023-09-05 ENCOUNTER — Emergency Department (HOSPITAL_BASED_OUTPATIENT_CLINIC_OR_DEPARTMENT_OTHER): Payer: 59

## 2023-09-05 ENCOUNTER — Other Ambulatory Visit: Payer: Self-pay

## 2023-09-05 DIAGNOSIS — R109 Unspecified abdominal pain: Secondary | ICD-10-CM | POA: Diagnosis not present

## 2023-09-05 DIAGNOSIS — F17210 Nicotine dependence, cigarettes, uncomplicated: Secondary | ICD-10-CM | POA: Insufficient documentation

## 2023-09-05 DIAGNOSIS — J029 Acute pharyngitis, unspecified: Secondary | ICD-10-CM | POA: Diagnosis not present

## 2023-09-05 DIAGNOSIS — R059 Cough, unspecified: Secondary | ICD-10-CM | POA: Diagnosis present

## 2023-09-05 DIAGNOSIS — Z20822 Contact with and (suspected) exposure to covid-19: Secondary | ICD-10-CM | POA: Diagnosis not present

## 2023-09-05 DIAGNOSIS — Z7951 Long term (current) use of inhaled steroids: Secondary | ICD-10-CM | POA: Diagnosis not present

## 2023-09-05 DIAGNOSIS — J45909 Unspecified asthma, uncomplicated: Secondary | ICD-10-CM | POA: Diagnosis not present

## 2023-09-05 DIAGNOSIS — M791 Myalgia, unspecified site: Secondary | ICD-10-CM | POA: Insufficient documentation

## 2023-09-05 LAB — RESP PANEL BY RT-PCR (RSV, FLU A&B, COVID)  RVPGX2
Influenza A by PCR: NEGATIVE
Influenza B by PCR: NEGATIVE
Resp Syncytial Virus by PCR: NEGATIVE
SARS Coronavirus 2 by RT PCR: NEGATIVE

## 2023-09-05 LAB — GROUP A STREP BY PCR: Group A Strep by PCR: NOT DETECTED

## 2023-09-05 NOTE — ED Triage Notes (Signed)
Pt reports cough, congestion, chills, and body aches. Pt reports feeling intermittent SHOB and lightheaded. Pt reports sore throat as well.

## 2023-09-06 ENCOUNTER — Emergency Department (HOSPITAL_BASED_OUTPATIENT_CLINIC_OR_DEPARTMENT_OTHER)
Admission: EM | Admit: 2023-09-06 | Discharge: 2023-09-06 | Disposition: A | Discharge status: Home / Self Care | Payer: 59 | Attending: Emergency Medicine | Admitting: Emergency Medicine

## 2023-09-06 DIAGNOSIS — J069 Acute upper respiratory infection, unspecified: Secondary | ICD-10-CM

## 2023-09-06 MED ORDER — GUAIFENESIN-DM 100-10 MG/5ML PO SYRP: 5.0000 mL | ORAL_SOLUTION | ORAL | 0 refills | Status: AC | PRN

## 2023-09-06 MED ORDER — OXYMETAZOLINE HCL 0.05 % NA SOLN: 1.0000 | Freq: Two times a day (BID) | NASAL | 0 refills | Status: AC

## 2023-09-06 MED ORDER — IBUPROFEN 600 MG PO TABS: 600.0000 mg | ORAL_TABLET | Freq: Four times a day (QID) | ORAL | 0 refills | Status: AC | PRN

## 2023-09-06 MED ORDER — ACETAMINOPHEN 325 MG PO TABS: 650.0000 mg | ORAL_TABLET | Freq: Four times a day (QID) | ORAL | 0 refills | Status: AC | PRN

## 2023-09-06 MED ORDER — PSEUDOEPHEDRINE HCL 60 MG PO TABS: 60.0000 mg | ORAL_TABLET | Freq: Four times a day (QID) | ORAL | 0 refills | Status: AC | PRN

## 2023-09-06 NOTE — Discharge Instructions (Addendum)
 You have been seen in the Emergency Department (ED) today for a likely viral illness.  Please drink plenty of clear fluids (water, Gatorade, chicken broth, etc).  You may use Tylenol  and/or Motrin  according to label instructions.  You can alternate between the two without any side effects.  Consider using humidifier in your bedroom.  Drink warm tea with honey to help with cough and sore throat.  Please follow up with your doctor as listed above.  Call your doctor or return to the Emergency Department (ED) if you are unable to tolerate fluids due to vomiting, have worsening trouble breathing, become extremely tired or difficult to awaken, or if you develop any other symptoms that concern you.

## 2023-09-06 NOTE — ED Provider Notes (Addendum)
 [place] [facility] AT [facility] Provider Note  CSN: [id number] Arrival date & time: 09/05/23 2135  Chief Complaint(s) Generalized Body Aches and Cough  HPI Raymond Montoya is a 33 y.o. male with past medical history as below, significant for asthma, tobacco use, who presents to the ED with complaint of URI symptoms, cough, body aches, chills, abdominal cramping, sore throat.  Feeling unwell the past 2 days.  Multiple sick contacts, spouse is also being evaluated similar symptoms.  History of asthma, has not had to use inhaler more frequently over the past few days.  No fevers but having some chills, body aches.  Some nausea, diarrhea.  Denies change to urination.  No significant dyspnea or chest pain  Past Medical History Past Medical History:  Diagnosis Date   Asthma    There are no active problems to display for this patient.  Home Medication(s) Prior to Admission medications   Medication Sig Start Date End Date Taking? Authorizing Provider  benzonatate  (TESSALON ) 100 MG capsule Take 1 capsule (100 mg total) by mouth 3 (three) times daily as needed. 07/13/23   Silver Wonda LABOR, PA  cetirizine -pseudoephedrine  (ZYRTEC -D) 5-120 MG tablet Take 1 tablet by mouth daily as needed for allergies. 07/13/23   Silver Wonda LABOR, PA  diazepam  (VALIUM ) 5 MG tablet Take 1 tablet (5 mg total) by mouth every 6 (six) hours as needed for muscle spasms. 05/11/15   Nguyen, Emily Roe, MD  fluticasone  (FLONASE ) 50 MCG/ACT nasal spray Place 1 spray into both nostrils daily. 12/07/15   Cleotilde Rogue, MD  HYDROcodone -acetaminophen  (NORCO) 5-325 MG tablet Take 1 tablet by mouth every 6 (six) hours as needed for moderate pain. 05/11/15   Nguyen, Emily Roe, MD  HYDROcodone -acetaminophen  (NORCO/VICODIN) 5-325 MG tablet Take 1 tablet by mouth every 6 (six) hours as needed for moderate pain. 05/26/15   Elpidio Lamp, MD  ibuprofen  (ADVIL ) 600 MG tablet Take 1 tablet (600 mg total) by mouth every  6 (six) hours as needed. 07/13/23   Silver Wonda LABOR, PA  methocarbamol  (ROBAXIN ) 500 MG tablet Take 1 tablet (500 mg total) by mouth 2 (two) times daily. 11/24/15   Muthersbaugh, Chiquita, PA-C  triamcinolone  (NASACORT ) 55 MCG/ACT AERO nasal inhaler Place 2 sprays into the nose daily. 07/13/23   Silver Wonda LABOR, PA                                                                                                                                    Past Surgical History History reviewed. No pertinent surgical history. Family History History reviewed. No pertinent family history.  Social History Social History   Tobacco Use   Smoking status: Every Day    Current packs/day: 0.50    Types: Cigarettes  Substance Use Topics   Alcohol use: No   Drug use: No   Allergies Penicillins  Review of Systems Review of Systems  Constitutional:  Positive for  chills.  HENT:  Positive for congestion, postnasal drip, rhinorrhea and sore throat.   Respiratory:  Positive for cough. Negative for chest tightness.   Cardiovascular:  Negative for palpitations.  Gastrointestinal:  Positive for diarrhea. Negative for abdominal pain.  Genitourinary:  Negative for dysuria.  Musculoskeletal:  Positive for arthralgias.  Neurological:  Negative for syncope.  All other systems reviewed and are negative.   Physical Exam Vital Signs  I have reviewed the triage vital signs BP 126/85 (BP Location: Right Arm)   Pulse 92   Temp 99.4 F (37.4 C) (Oral)   Resp 20   Ht 5' 7 (1.702 m)   Wt 98 kg   SpO2 97%   BMI 33.83 kg/m  Physical Exam Vitals and nursing note reviewed.  Constitutional:      General: He is not in acute distress.    Appearance: Normal appearance. He is well-developed. He is not ill-appearing.  HENT:     Head: Normocephalic and atraumatic.     Right Ear: External ear normal.     Left Ear: External ear normal.     Nose:     Comments: Clear rhinorrhea    Mouth/Throat:     Mouth: Mucous  membranes are moist.     Pharynx: Oropharynx is clear. Uvula midline. Postnasal drip present. No oropharyngeal exudate or uvula swelling.  Eyes:     General: No scleral icterus. Cardiovascular:     Rate and Rhythm: Normal rate and regular rhythm.     Pulses: Normal pulses.     Heart sounds: Normal heart sounds.  Pulmonary:     Effort: Pulmonary effort is normal. No respiratory distress.     Breath sounds: Normal breath sounds.  Abdominal:     General: Abdomen is flat.     Palpations: Abdomen is soft.     Tenderness: There is no abdominal tenderness.  Musculoskeletal:     Cervical back: No rigidity.     Right lower leg: No edema.     Left lower leg: No edema.  Skin:    General: Skin is warm and dry.     Capillary Refill: Capillary refill takes less than 2 seconds.  Neurological:     Mental Status: He is alert.  Psychiatric:        Mood and Affect: Mood normal.        Behavior: Behavior normal.     ED Results and Treatments Labs (all labs ordered are listed, but only abnormal results are displayed) Labs Reviewed  RESP PANEL BY RT-PCR (RSV, FLU A&B, COVID)  RVPGX2  GROUP A STREP BY PCR                                                                                                                          Radiology DG Chest 2 View Result Date: 09/05/2023 CLINICAL DATA:  Cough EXAM: CHEST - 2 VIEW COMPARISON:  07/13/2023 FINDINGS: The heart size and mediastinal contours are within normal limits.  Both lungs are clear. The visualized skeletal structures are unremarkable. IMPRESSION: No active cardiopulmonary disease. Electronically Signed   By: Franky Stanford M.D.   On: 09/05/2023 23:50    Pertinent labs & imaging results that were available during my care of the patient were reviewed by me and considered in my medical decision making (see MDM for details).  Medications Ordered in ED Medications - No data to display                                                                                                                                    Procedures Procedures  (including critical care time)  Medical Decision Making / ED Course    Medical Decision Making:    Raymond Montoya is a 33 y.o. male  with past medical history as below, significant for asthma, tobacco use, who presents to the ED with complaint of URI symptoms, cough, body aches, chills, abdominal cramping, sore throat.. The complaint involves an extensive differential diagnosis and also carries with it a high risk of complications and morbidity.  Serious etiology was considered. Ddx includes but is not limited to: Influenza, COVID-19, RSV, asthma exacerbation, pneumonia, etc.  Complete initial physical exam performed, notably the patient was in distress no hypoxia, low-grade fever noted 9 9.4.    Reviewed and confirmed nursing documentation for past medical history, family history, social history.  Vital signs reviewed.         Brief summary: Well-appearing 33 year old history of asthma here with URI symptoms.  Multiple sick contacts with similar symptoms.  Lungs clear bilateral, no hypoxia.  Abdomen is benign.  Tolerant p.o. without difficulty.  Swabs ordered in triage are negative.  Chest x-ray ordered in triage is unremarkable.  Ambulatory without difficulty.  No UTI symptoms.  Favor likely viral syndrome.  Discussed supportive care at home, strict return precautions,  The patient improved significantly and was discharged in stable condition. Detailed discussions were had with the patient/guardian regarding current findings, and need for close f/u with PCP or on call doctor. The patient/guardian has been instructed to return immediately if the symptoms worsen in any way for re-evaluation. Patient/guardian verbalized understanding and is in agreement with current care plan. All questions answered prior to discharge.                  Additional history obtained: -Additional history  obtained from na -External records from outside source obtained and reviewed including: Chart review including previous notes, labs, imaging, consultation notes including  Home medications, prior ED visits, prior labs   Lab Tests: -I ordered, reviewed, and interpreted labs.   The pertinent results include:   Labs Reviewed  RESP PANEL BY RT-PCR (RSV, FLU A&B, COVID)  RVPGX2  GROUP A STREP BY PCR    Notable for swabs negative  EKG   EKG Interpretation Date/Time:    Ventricular Rate:    PR Interval:  QRS Duration:    QT Interval:    QTC Calculation:   R Axis:      Text Interpretation:           Imaging Studies ordered: I ordered imaging studies including chest x-ray I independently visualized the following imaging with scope of interpretation limited to determining acute life threatening conditions related to emergency care; findings noted above I independently visualized and interpreted imaging. I agree with the radiologist interpretation   Medicines ordered and prescription drug management: No orders of the defined types were placed in this encounter.   -I have reviewed the patients home medicines and have made adjustments as needed   Consultations Obtained: na   Cardiac Monitoring: Continuous pulse oximetry interpreted by myself, 97% on RA.    Social Determinants of Health:  Diagnosis or treatment significantly limited by social determinants of health: current smoker, no pcp Counseled patient for approximately 3 minutes regarding smoking cessation. Discussed risks of smoking and how they applied and affected their visit here today. Patient not ready to quit at this time, however will follow up with their primary doctor when they are.   CPT code: 00593: intermediate counseling for smoking cessation     Reevaluation: After the interventions noted above, I reevaluated the patient and found that they have stayed the same  Co morbidities that complicate  the patient evaluation  Past Medical History:  Diagnosis Date   Asthma       Dispostion: Disposition decision including need for hospitalization was considered, and patient discharged from emergency department.    Final Clinical Impression(s) / ED Diagnoses Final diagnoses:  None        Elnor Jayson LABOR, DO 09/06/23 0209    Elnor Jayson LABOR, DO 09/06/23 0209
